# Patient Record
Sex: Female | Born: 1950 | Race: Black or African American | Hispanic: No | Marital: Single
Health system: Southern US, Community
[De-identification: ages and names within clinical notes are randomized; demographics above are authoritative.]

---

## 2008-02-23 ENCOUNTER — Other Ambulatory Visit: Payer: Self-pay

## 2008-02-23 ENCOUNTER — Other Ambulatory Visit: Payer: Self-pay | Admitting: Emergency Medicine

## 2008-02-23 ENCOUNTER — Inpatient Hospital Stay (HOSPITAL_COMMUNITY): Admission: AD | Admit: 2008-02-23 | Discharge: 2008-02-28 | Payer: Self-pay | Admitting: Psychiatry

## 2008-02-24 ENCOUNTER — Ambulatory Visit: Payer: Self-pay | Admitting: Psychiatry

## 2008-09-15 ENCOUNTER — Encounter: Admission: RE | Admit: 2008-09-15 | Discharge: 2008-09-15 | Payer: Self-pay | Admitting: General Surgery

## 2008-09-17 ENCOUNTER — Ambulatory Visit (HOSPITAL_BASED_OUTPATIENT_CLINIC_OR_DEPARTMENT_OTHER): Admission: RE | Admit: 2008-09-17 | Discharge: 2008-09-17 | Payer: Self-pay | Admitting: General Surgery

## 2008-09-17 ENCOUNTER — Encounter (INDEPENDENT_AMBULATORY_CARE_PROVIDER_SITE_OTHER): Payer: Self-pay | Admitting: General Surgery

## 2009-05-25 IMAGING — CR DG CHEST 2V
2 series · 2 of 2 positions shown · non-contrast
Comparison: None

CLINICAL DATA: Preop for surgery for left axillary adenopathy

CHEST - 2 VIEW

[w chest pa]
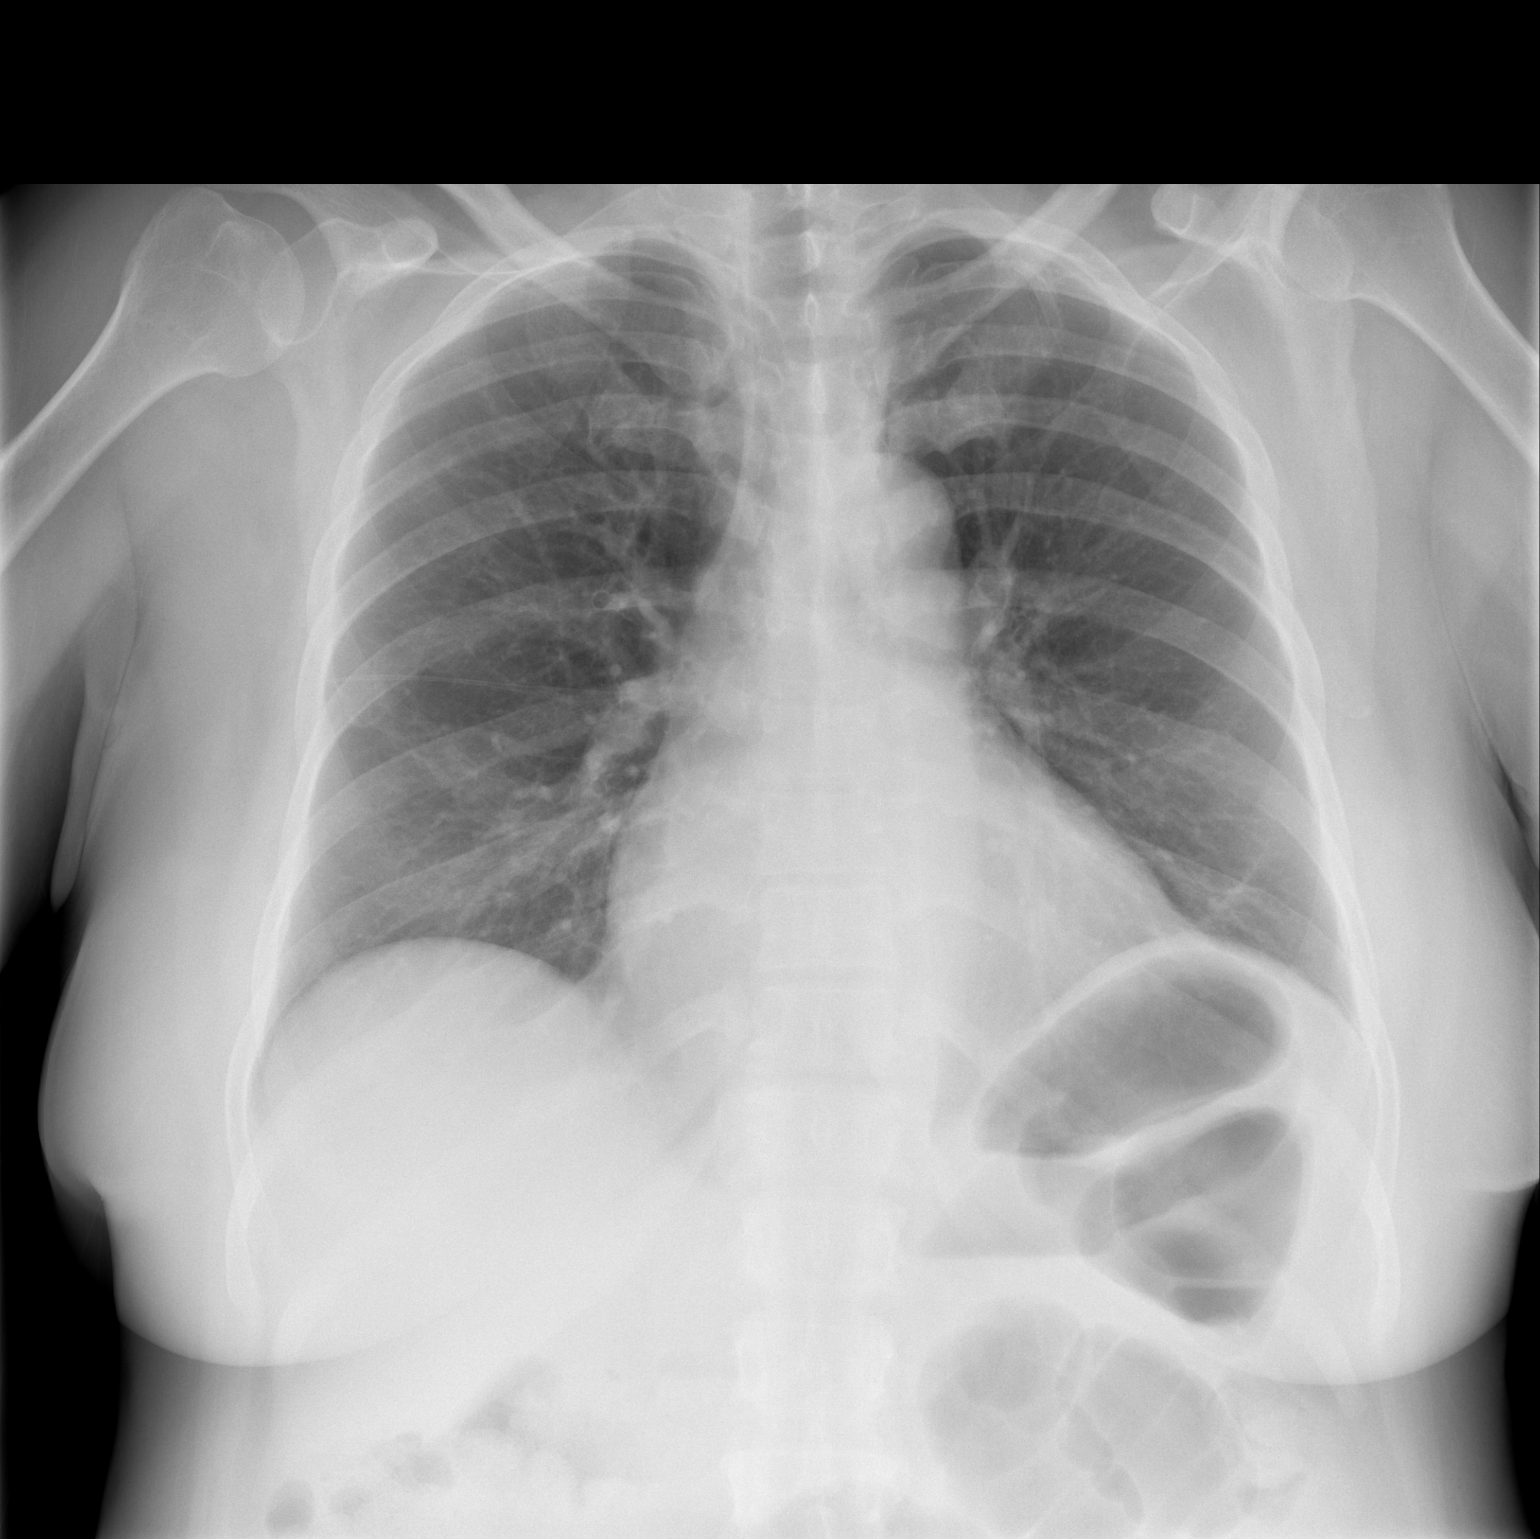

[w chest lat]
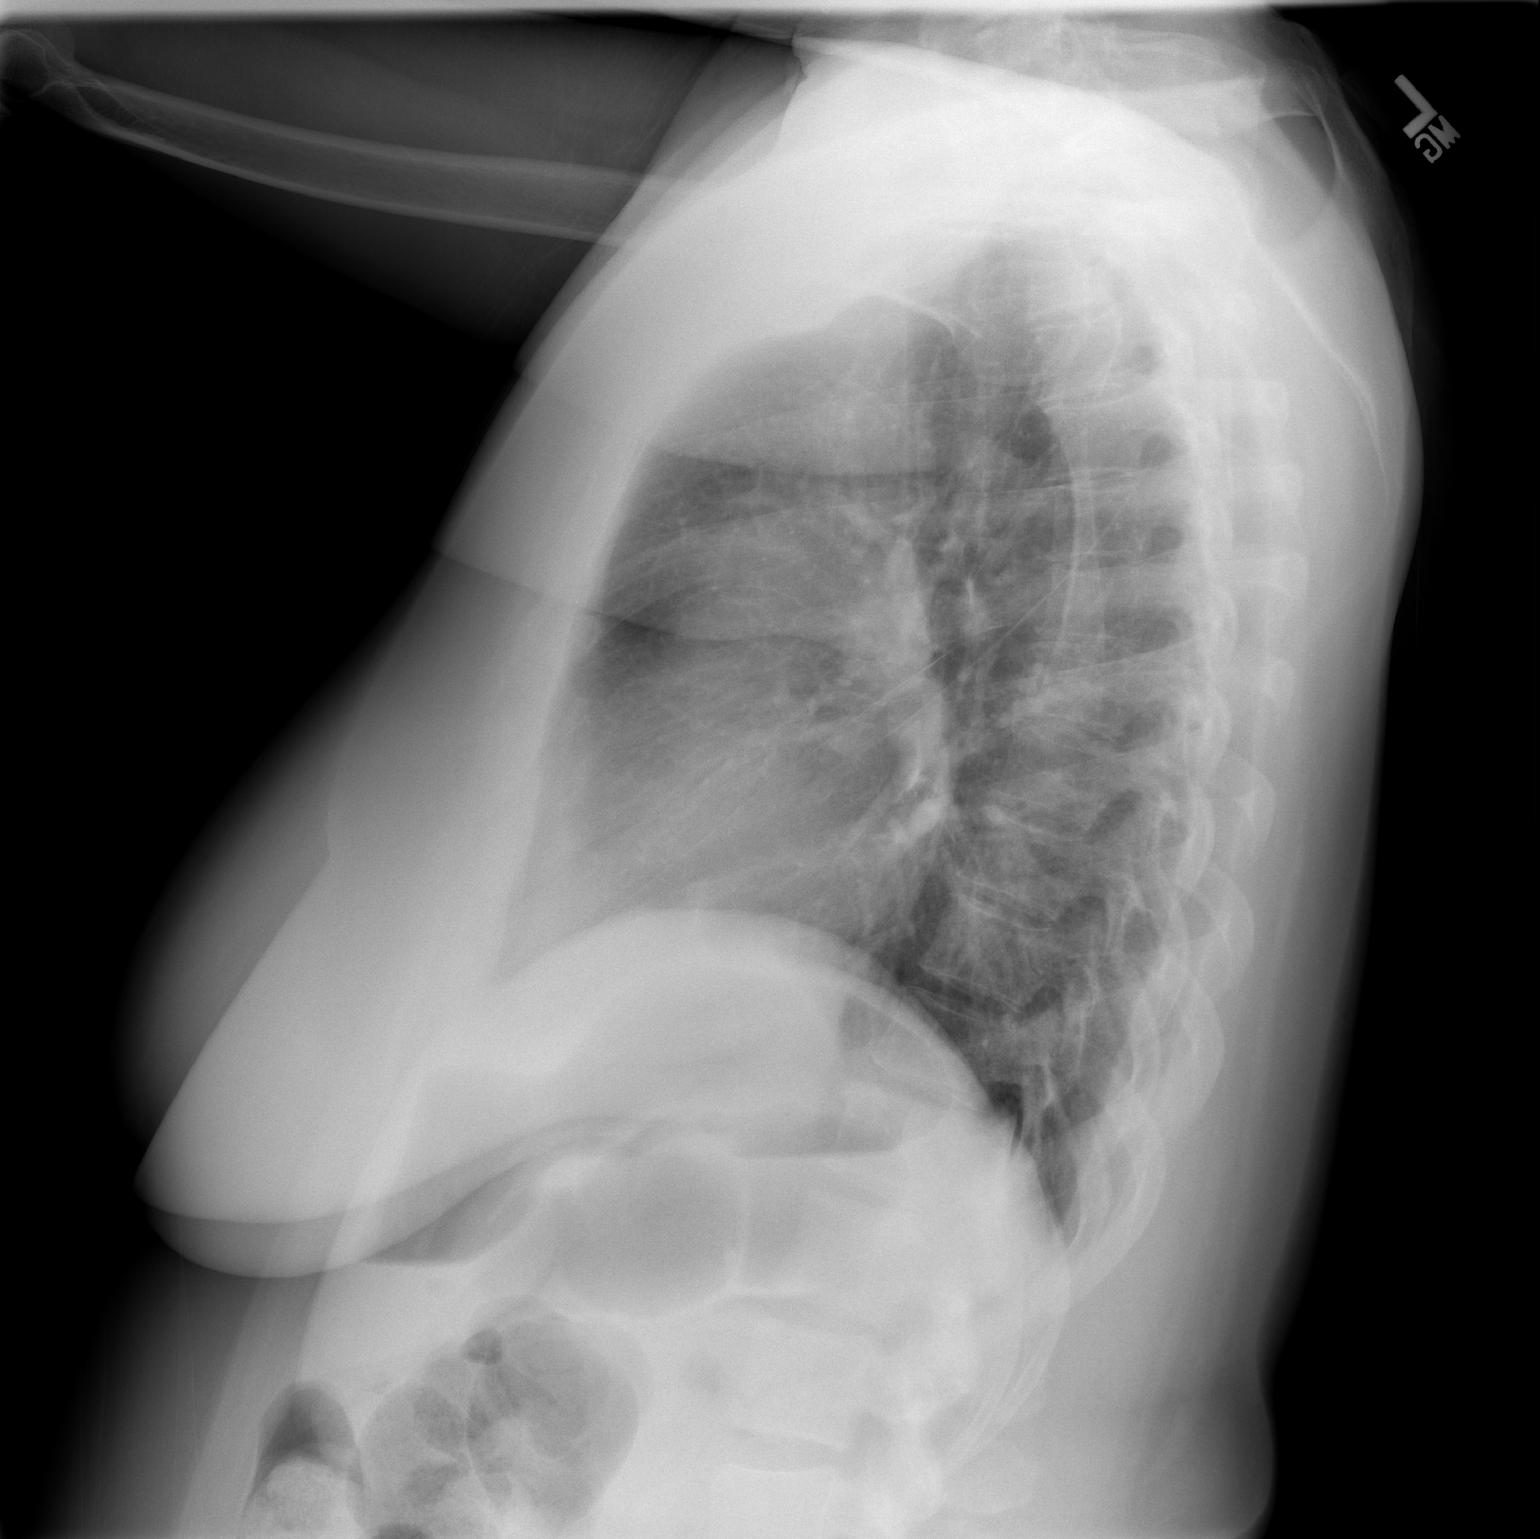

[2 of 2 positions shown; findings below may reference images not displayed]

FINDINGS: The lungs are clear.  The heart is within upper limits of
normal.  No acute bony abnormality is seen.
IMPRESSION: No active lung disease.  Heart upper normal.

## 2011-02-14 NOTE — Discharge Summary (Signed)
NAMESACHE, SANE NO.:  [id number]   MEDICAL RECORD NO.:  [id number]          PATIENT TYPE:  IPS   LOCATION:  0403                          [facility]ACILITY:  [facility]   PHYSICIAN:  Antonietta Breach, M.D.  DATE OF BIRTH:  [birth year]   DATE OF ADMISSION:  02/23/2008  DATE OF DISCHARGE:  02/27/2008                               DISCHARGE SUMMARY   IDENTIFYING DATA AND REASON FOR ADMISSION:  Ms. Maniaci is a 60 year old  female admitted to the inpatient adult psychiatric unit of [facility] due to bizarre behavior.  She was responding to auditory  hallucinations.  She was also making threats to another person.  Her son  petitioned for her commitment.   Notable medical and laboratory unremarkable.   HOSPITAL COURSE:  Ms. Keir was admitted to the inpatient adult  psychiatric unit and underwent familial and group psychotherapy.  Her  Topamax was increased to 75 mg b.i.d. in order to reduce her mood  lability and mood expansiveness.  Her Zyprexa was re-titrated to 30 mg  nightly for anti-psychosis and acute mood stabilization.  The patient  progressively became socially cooperative and appropriate.  She resolved  her hallucinations.  She had no adverse effects of medication.   CONDITION ON DISCHARGE:  By May 28, Ms. Suriano is socially appropriate  and cooperative.  Her mood is within normal limits.  Her affect is  bright and appropriate.  Her speech is within normal limits.  Her  appetite is normal.  Her sleep is normal.  She is not having any racing  thoughts, hallucinations, delusions, thoughts of harming others, or  thoughts of harming herself.  She is not having any adverse medication  effects.   MENTAL STATUS UPON DISCHARGE:  Please see the paragraph above.  The  patient is oriented to all spheres.  Her judgment is intact.  Her  insight is intact.  She has constructive future goals and interest.  She  is alert.   AFTERCARE:  Please see the discharge  instruction sheet.   DISCHARGE MEDICATIONS:  1. Norvasc 5 mg daily.  2. Lipitor 10 mg daily.  3. Lopressor 50 mg b.i.d.  4. Zyprexa 30 mg nightly.  5. Topamax 75 mg b.i.d.   DISCHARGE DIAGNOSES:  AXIS I:  Schizoaffective disorder, stable.  AXIS II:  Deferred.  AXIS III:  Hypertension, hypercholesterolemia.  AXIS IV:  Primary support group.  AXIS V:  55.   CONDITION ON DISCHARGE:  Ms. Lanzo is not at risk to harm herself or  others.  She agrees to call emergency services immediately for any  thoughts of harming herself, thoughts of harming others, or distress.      Antonietta Breach, M.D.  Electronically Signed     JW/MEDQ  D:  02/27/2008  T:  02/27/2008  Job:  [id number]

## 2011-02-14 NOTE — Op Note (Signed)
NAMELEEA, Katrina Haynes NO.:  [id number]   MEDICAL RECORD NO.:  [id number]          PATIENT TYPE:  AMB   LOCATION:  DSC                          FACILITY:  [facility]   PHYSICIAN:  Juanetta Gosling, MDDATE OF BIRTH:  06-14-1951   DATE OF PROCEDURE:  09/17/2008  DATE OF DISCHARGE:                               OPERATIVE REPORT   PREOPERATIVE DIAGNOSIS:  Bilateral axillary adenopathy.   POSTOPERATIVE DIAGNOSIS:  Bilateral axillary adenopathy.   PROCEDURE:  Left axillary lymph node biopsy.   SURGEON:  Troy Sine. Dwain Sarna, MD   ASSISTANT:  None.   ANESTHESIA:  General.   SPECIMENS:  Left axillary lymph node to Pathology.   ESTIMATED BLOOD LOSS:  Minimal.   COMPLICATIONS:  None.   DRAINS:  None.   DISPOSITION:  To PACU in stable condition.   HISTORY:  Ms. Salata is a 60 year old female with a history of mammogram  showing bilateral enlarged lymph nodes probably read as likely reactive.  She underwent a second look with a mammogram and ultrasound recently,  which shows bilateral axillary adenopathy and recommended a biopsy.  Given her history of schizophrenia, bipolar disorder, inability to sit  still so she is not a candidate for radiologic biopsy.  She does not  have any weight changes, no night sweats that she can describe to me  either.  I counseled for an open left axillary lymph node biopsy.   PROCEDURE:  After informed consent was obtained, the patient was  administered 1 g of intravenous cefazolin.  She was taken to the  operating room where she had sequential compression devices placed on  her lower extremities.  She then underwent placed general anesthesia  with an LMA without complication.  Her left axilla was then prepped and  draped in the standard sterile surgical fashion.  A surgical time-out  was then performed.   A 4-mm incision was then made in her left axilla at the bottom of her  hair line where I palpated a large node.  This was  carried out down to  axillary fascia, which was then entered.  The node was identified and  dissected free from the surrounding structures and brought up into the  wound.  The base of it was clamped with a right angle and the node was  excised and passed off table as specimen.  The base was then tied with a  2-0 Vicryl tie.  Hemostasis was observed.  Irrigation was performed.  The axillary fascia was closed with a 3-0 Vicryl suture.  The skin was  closed  with a 4-0 Monocryl in a subcuticular fashion.  Dermabond was then  placed over the wound and local anesthetic was placed in the wound  consistent with 0.25% Marcaine.  She tolerated this well, was extubated  in the operating room, and transferred to the recovery room in a stable  condition.      Juanetta Gosling, MD  Electronically Signed     MCW/MEDQ  D:  09/17/2008  T:  09/18/2008  Job:  [id number]   cc:   Fleet Contras, M.D.
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
## 2011-06-28 LAB — RAPID URINE DRUG SCREEN, HOSP PERFORMED
Amphetamines: NOT DETECTED
Barbiturates: NOT DETECTED
Cocaine: NOT DETECTED
Opiates: NOT DETECTED
Tetrahydrocannabinol: NOT DETECTED

## 2011-06-28 LAB — POCT I-STAT, CHEM 8
Calcium, Ion: 1.2
Chloride: 108
Creatinine, Ser: 0.8
Glucose, Bld: 134 — ABNORMAL HIGH
Potassium: 3.4 — ABNORMAL LOW

## 2011-06-28 LAB — DIFFERENTIAL
Basophils Absolute: 0
Basophils Relative: 0
Eosinophils Absolute: 0.1
Lymphocytes Relative: 27
Monocytes Relative: 9
Neutro Abs: 5.2
Neutrophils Relative %: 63

## 2011-06-28 LAB — URINALYSIS, ROUTINE W REFLEX MICROSCOPIC
Bilirubin Urine: NEGATIVE
Hgb urine dipstick: NEGATIVE
Ketones, ur: NEGATIVE
Nitrite: NEGATIVE
Specific Gravity, Urine: 1.013
pH: 6

## 2011-06-28 LAB — CBC
Hemoglobin: 13.1
RBC: 5.8 — ABNORMAL HIGH

## 2011-06-28 LAB — ETHANOL: Alcohol, Ethyl (B): <5

## 2011-07-07 LAB — CBC
HCT: 36.8 % (ref 36.0–46.0)
Hemoglobin: 11.9 g/dL — ABNORMAL LOW (ref 12.0–15.0)
MCHC: 32.2 g/dL (ref 30.0–36.0)
MCV: 72.7 fL — ABNORMAL LOW (ref 78.0–100.0)
Platelets: 356 10*3/uL (ref 150–400)
RDW: 14.4 % (ref 11.5–15.5)

## 2011-07-07 LAB — DIFFERENTIAL
Basophils Relative: 0 % (ref 0–1)
Lymphocytes Relative: 24 % (ref 12–46)
Monocytes Absolute: 0.5 10*3/uL (ref 0.1–1.0)
Monocytes Relative: 8 % (ref 3–12)
Neutro Abs: 3.9 10*3/uL (ref 1.7–7.7)
Neutrophils Relative %: 67 % (ref 43–77)

## 2011-07-07 LAB — COMPREHENSIVE METABOLIC PANEL
Albumin: 3.5 g/dL (ref 3.5–5.2)
Alkaline Phosphatase: 90 U/L (ref 39–117)
BUN: 7 mg/dL (ref 6–23)
Calcium: 9.5 mg/dL (ref 8.4–10.5)
Creatinine, Ser: 0.68 mg/dL (ref 0.4–1.2)
Glucose, Bld: 92 mg/dL (ref 70–99)
Total Protein: 7 g/dL (ref 6.0–8.3)

## 2021-11-18 ENCOUNTER — Telehealth: Payer: Self-pay | Admitting: Internal Medicine

## 2021-11-18 NOTE — Telephone Encounter (Signed)
Did not need this encounter °
